# Patient Record
Sex: Male | Born: 1970 | Race: Black or African American | Hispanic: No | Marital: Single | Smoking: Never smoker
Health system: Southern US, Community
[De-identification: ages and names within clinical notes are randomized; demographics above are authoritative.]

## PROBLEM LIST (undated history)

## (undated) DIAGNOSIS — C61 Malignant neoplasm of prostate: Secondary | ICD-10-CM

## (undated) DIAGNOSIS — I10 Essential (primary) hypertension: Secondary | ICD-10-CM

## (undated) HISTORY — PX: PROSTATE BIOPSY: SHX241

---

## 2011-05-18 ENCOUNTER — Ambulatory Visit: Payer: Self-pay | Admitting: Physician Assistant

## 2011-05-25 ENCOUNTER — Ambulatory Visit: Payer: Self-pay | Admitting: Physician Assistant

## 2011-06-04 ENCOUNTER — Ambulatory Visit (INDEPENDENT_AMBULATORY_CARE_PROVIDER_SITE_OTHER): Payer: 59

## 2011-06-04 DIAGNOSIS — B9789 Other viral agents as the cause of diseases classified elsewhere: Secondary | ICD-10-CM

## 2011-06-22 ENCOUNTER — Encounter (INDEPENDENT_AMBULATORY_CARE_PROVIDER_SITE_OTHER): Payer: 59 | Admitting: Physician Assistant

## 2011-06-22 DIAGNOSIS — I10 Essential (primary) hypertension: Secondary | ICD-10-CM

## 2011-06-22 DIAGNOSIS — Z0000 Encounter for general adult medical examination without abnormal findings: Secondary | ICD-10-CM

## 2011-10-19 ENCOUNTER — Other Ambulatory Visit: Payer: Self-pay | Admitting: Physician Assistant

## 2012-01-30 ENCOUNTER — Emergency Department (HOSPITAL_BASED_OUTPATIENT_CLINIC_OR_DEPARTMENT_OTHER)
Admission: EM | Admit: 2012-01-30 | Discharge: 2012-01-30 | Disposition: A | Discharge status: Home / Self Care | Payer: Self-pay | Attending: Emergency Medicine | Admitting: Emergency Medicine

## 2012-01-30 ENCOUNTER — Encounter (HOSPITAL_BASED_OUTPATIENT_CLINIC_OR_DEPARTMENT_OTHER): Payer: Self-pay | Admitting: *Deleted

## 2012-01-30 DIAGNOSIS — K089 Disorder of teeth and supporting structures, unspecified: Secondary | ICD-10-CM | POA: Insufficient documentation

## 2012-01-30 DIAGNOSIS — I10 Essential (primary) hypertension: Secondary | ICD-10-CM | POA: Insufficient documentation

## 2012-01-30 DIAGNOSIS — K0889 Other specified disorders of teeth and supporting structures: Secondary | ICD-10-CM

## 2012-01-30 HISTORY — DX: Essential (primary) hypertension: I10

## 2012-01-30 MED ORDER — HYDROCODONE-ACETAMINOPHEN 10-500 MG PO TABS: 1.0000 | ORAL_TABLET | Freq: Four times a day (QID) | ORAL | Status: AC | PRN

## 2012-01-30 MED ORDER — PENICILLIN V POTASSIUM 500 MG PO TABS: 500.0000 mg | ORAL_TABLET | Freq: Four times a day (QID) | ORAL | Status: AC

## 2012-01-30 MED ORDER — HYDROCODONE-ACETAMINOPHEN 5-325 MG PO TABS
1.0000 | ORAL_TABLET | Freq: Once | ORAL | Status: AC
  Administered 2012-01-30: 1 via ORAL
  Filled 2012-01-30: qty 1

## 2012-01-30 NOTE — ED Notes (Signed)
Pt. Does have small amt of edema in the lower R jaw area.

## 2012-01-30 NOTE — ED Provider Notes (Signed)
History     CSN: [id number]  Arrival date & time 01/30/12  0016   First MD Initiated Contact with Patient 01/30/12 [phone]      Chief Complaint  Patient presents with  . Dental Pain    (Consider location/radiation/quality/duration/timing/severity/associated sxs/prior treatment) HPI Is a 41 year old black male with a several day history of pain in his right lower second molar. The smaller has been previously filled but he believes it is cracked. He is having moderate to severe pain in it. He took a friend's hydrocodone with relief but requests additional medication pending followup with his dentist the day after tomorrow. He has an appointment.  Past Medical History  Diagnosis Date  . Hypertension     History reviewed. No pertinent past surgical history.  No family history on file.  History  Substance Use Topics  . Smoking status: Not on file  . Smokeless tobacco: Not on file  . Alcohol Use:       Review of Systems  All other systems reviewed and are negative.    Allergies  Review of patient's allergies indicates no known allergies.  Home Medications   Current Outpatient Rx  Name Route Sig Dispense Refill  . LISINOPRIL-HYDROCHLOROTHIAZIDE 10-12.5 MG PO TABS  TAKE ONE TABLET BY MOUTH EVERY DAY 30 tablet 2    BP 135/81  Pulse 101  Temp 98.6 F (37 C) (Oral)  Resp 18  Ht 6' (1.829 m)  Wt 230 lb (104.327 kg)  BMI 31.19 kg/m2  SpO2 100%  Physical Exam General: Well-developed, well-nourished male in no acute distress; appearance consistent with age of record HENT: normocephalic, atraumatic; right lower second molar with amalgam filling, mildly tender to percussion Eyes: pupils equal round and reactive to light; extraocular muscles intact; arcus senilis bilateral Neck: supple; no lymphadenopathy Heart: regular rate and rhythm Lungs: clear to auscultation bilaterally Abdomen: soft; nondistended Extremities: No deformity; full range of motion Neurologic:  Awake, alert and oriented; motor function intact in all extremities and symmetric; no facial droop Skin: Warm and dry Psychiatric: Normal mood and affect    ED Course  Procedures (including critical care time)     MDM          Hanley Seamen, MD 01/30/12 1191

## 2012-03-16 ENCOUNTER — Other Ambulatory Visit: Payer: Self-pay | Admitting: Physician Assistant

## 2012-07-31 ENCOUNTER — Other Ambulatory Visit: Payer: Self-pay | Admitting: Physician Assistant

## 2012-08-01 NOTE — Telephone Encounter (Signed)
Please pull chart.

## 2012-08-02 NOTE — Telephone Encounter (Signed)
Chart pulled at pa pool DOS: 06/22/11

## 2012-11-01 ENCOUNTER — Other Ambulatory Visit: Payer: Self-pay | Admitting: Physician Assistant

## 2013-02-20 ENCOUNTER — Ambulatory Visit (INDEPENDENT_AMBULATORY_CARE_PROVIDER_SITE_OTHER): Payer: 59 | Admitting: Emergency Medicine

## 2013-02-20 ENCOUNTER — Telehealth: Payer: Self-pay

## 2013-02-20 VITALS — BP 120/78 | HR 88 | Temp 98.7°F | Resp 18 | Ht 72.5 in | Wt 232.0 lb

## 2013-02-20 DIAGNOSIS — I10 Essential (primary) hypertension: Secondary | ICD-10-CM

## 2013-02-20 LAB — LIPID PANEL
HDL: 66 mg/dL (ref >39)
LDL Cholesterol: 125 mg/dL — ABNORMAL HIGH (ref 0–99)
Total CHOL/HDL Ratio: 3 Ratio
Triglycerides: 42 mg/dL (ref <150)

## 2013-02-20 LAB — COMPREHENSIVE METABOLIC PANEL
AST: 22 U/L (ref 0–37)
Alkaline Phosphatase: 48 U/L (ref 39–117)
BUN: 13 mg/dL (ref 6–23)
Creat: 0.97 mg/dL (ref 0.50–1.35)

## 2013-02-20 MED ORDER — LISINOPRIL-HYDROCHLOROTHIAZIDE 10-12.5 MG PO TABS: 1.0000 | ORAL_TABLET | Freq: Every day | ORAL | Status: DC

## 2013-02-20 NOTE — Telephone Encounter (Signed)
Patient calling to get a refill on his lisinopril was here DOS: 06/22/11; no epic visits. He was told to contact us by his pharmacy because they have not received anything yet. Patient uses pharmacy at [facility]. Samuel Allen- [address].  Please notify patient if this can be refilled or not.   Thanks!  Best number: [phone]

## 2013-02-20 NOTE — Telephone Encounter (Signed)
Please advise this patient that he needs to be seen.  Last seen 06/22/2011.

## 2013-02-20 NOTE — Telephone Encounter (Signed)
Spoke with patient he doesn't have insurance but will speak with someone about payment arrangements but he will come in to get medicine filled

## 2013-02-20 NOTE — Patient Instructions (Addendum)

## 2013-02-20 NOTE — Addendum Note (Signed)
Addended by: Carmelina Dane on: 02/20/2013 04:16 PM   Modules accepted: Orders

## 2013-02-20 NOTE — Progress Notes (Signed)
[facility] [address] [place] [address] [phone]  Date:  02/20/2013   Name:  Samuel Allen   DOB:  April 11, 1971   MRN:  [id number]  PCP:  No PCP Per Patient    Chief Complaint: Follow-up   History of Present Illness:  Samuel Allen is a 42 y.o. very pleasant male patient who presents with the following:  History of hypertension.  Under good control with no adverse side effect.  No improvement with over the counter medications or other home remedies. Denies other complaint or health concern today.   There are no active problems to display for this patient.   Past Medical History  Diagnosis Date  . Hypertension     History reviewed. No pertinent past surgical history.  History  Substance Use Topics  . Smoking status: Never Smoker   . Smokeless tobacco: Not on file  . Alcohol Use: Yes    History reviewed. No pertinent family history.  No Known Allergies  Medication list has been reviewed and updated.  Current Outpatient Prescriptions on File Prior to Visit  Medication Sig Dispense Refill  . lisinopril-hydrochlorothiazide (PRINZIDE,ZESTORETIC) 10-12.5 MG per tablet Take 1 tablet by mouth daily. Needs office visit/CPE  30 tablet  0   No current facility-administered medications on file prior to visit.    Review of Systems:  As per HPI, otherwise negative.    Physical Examination: Filed Vitals:   02/20/13 1601  BP: 120/78  Pulse: 88  Temp: 98.7 F (37.1 C)  Resp: 18   Filed Vitals:   02/20/13 1601  Height: 6' 0.5" (1.842 m)  Weight: 232 lb (105.235 kg)   Body mass index is 31.02 kg/(m^2). Ideal Body Weight: Weight in (lb) to have BMI = 25: 186.5  GEN: WDWN, NAD, Non-toxic, A & O x 3 HEENT: Atraumatic, Normocephalic. Neck supple. No masses, No LAD. Ears and Nose: No external deformity. CV: RRR, No M/G/R. No JVD. No thrill. No extra heart sounds. PULM: CTA B, no wheezes, crackles, rhonchi. No retractions. No resp.  distress. No accessory muscle use. ABD: S, NT, ND, +BS. No rebound. No HSM. EXTR: No c/c/e NEURO Normal gait.  PSYCH: Normally interactive. Conversant. Not depressed or anxious appearing.  Calm demeanor.    Assessment and Plan: Hypertension Refill meds Labs  Signed,  Phillips Odor, MD

## 2015-01-29 ENCOUNTER — Other Ambulatory Visit: Payer: Self-pay | Admitting: Emergency Medicine

## 2018-07-26 ENCOUNTER — Other Ambulatory Visit: Payer: Self-pay | Admitting: Family

## 2018-07-26 ENCOUNTER — Ambulatory Visit
Admission: RE | Admit: 2018-07-26 | Discharge: 2018-07-26 | Disposition: A | Discharge status: Home / Self Care | Payer: BLUE CROSS/BLUE SHIELD | Source: Ambulatory Visit | Attending: Family | Admitting: Family

## 2018-07-26 DIAGNOSIS — R059 Cough, unspecified: Secondary | ICD-10-CM

## 2018-07-26 DIAGNOSIS — R05 Cough: Secondary | ICD-10-CM

## 2018-07-26 DIAGNOSIS — R0600 Dyspnea, unspecified: Secondary | ICD-10-CM

## 2018-07-26 IMAGING — CR DG CHEST 2V
2 series · 2 of 2 positions shown · non-contrast
Comparison: None.

CLINICAL DATA: Productive cough.  Shortness of breath.

EXAM:
CHEST - 2 VIEW

[w chest pa]
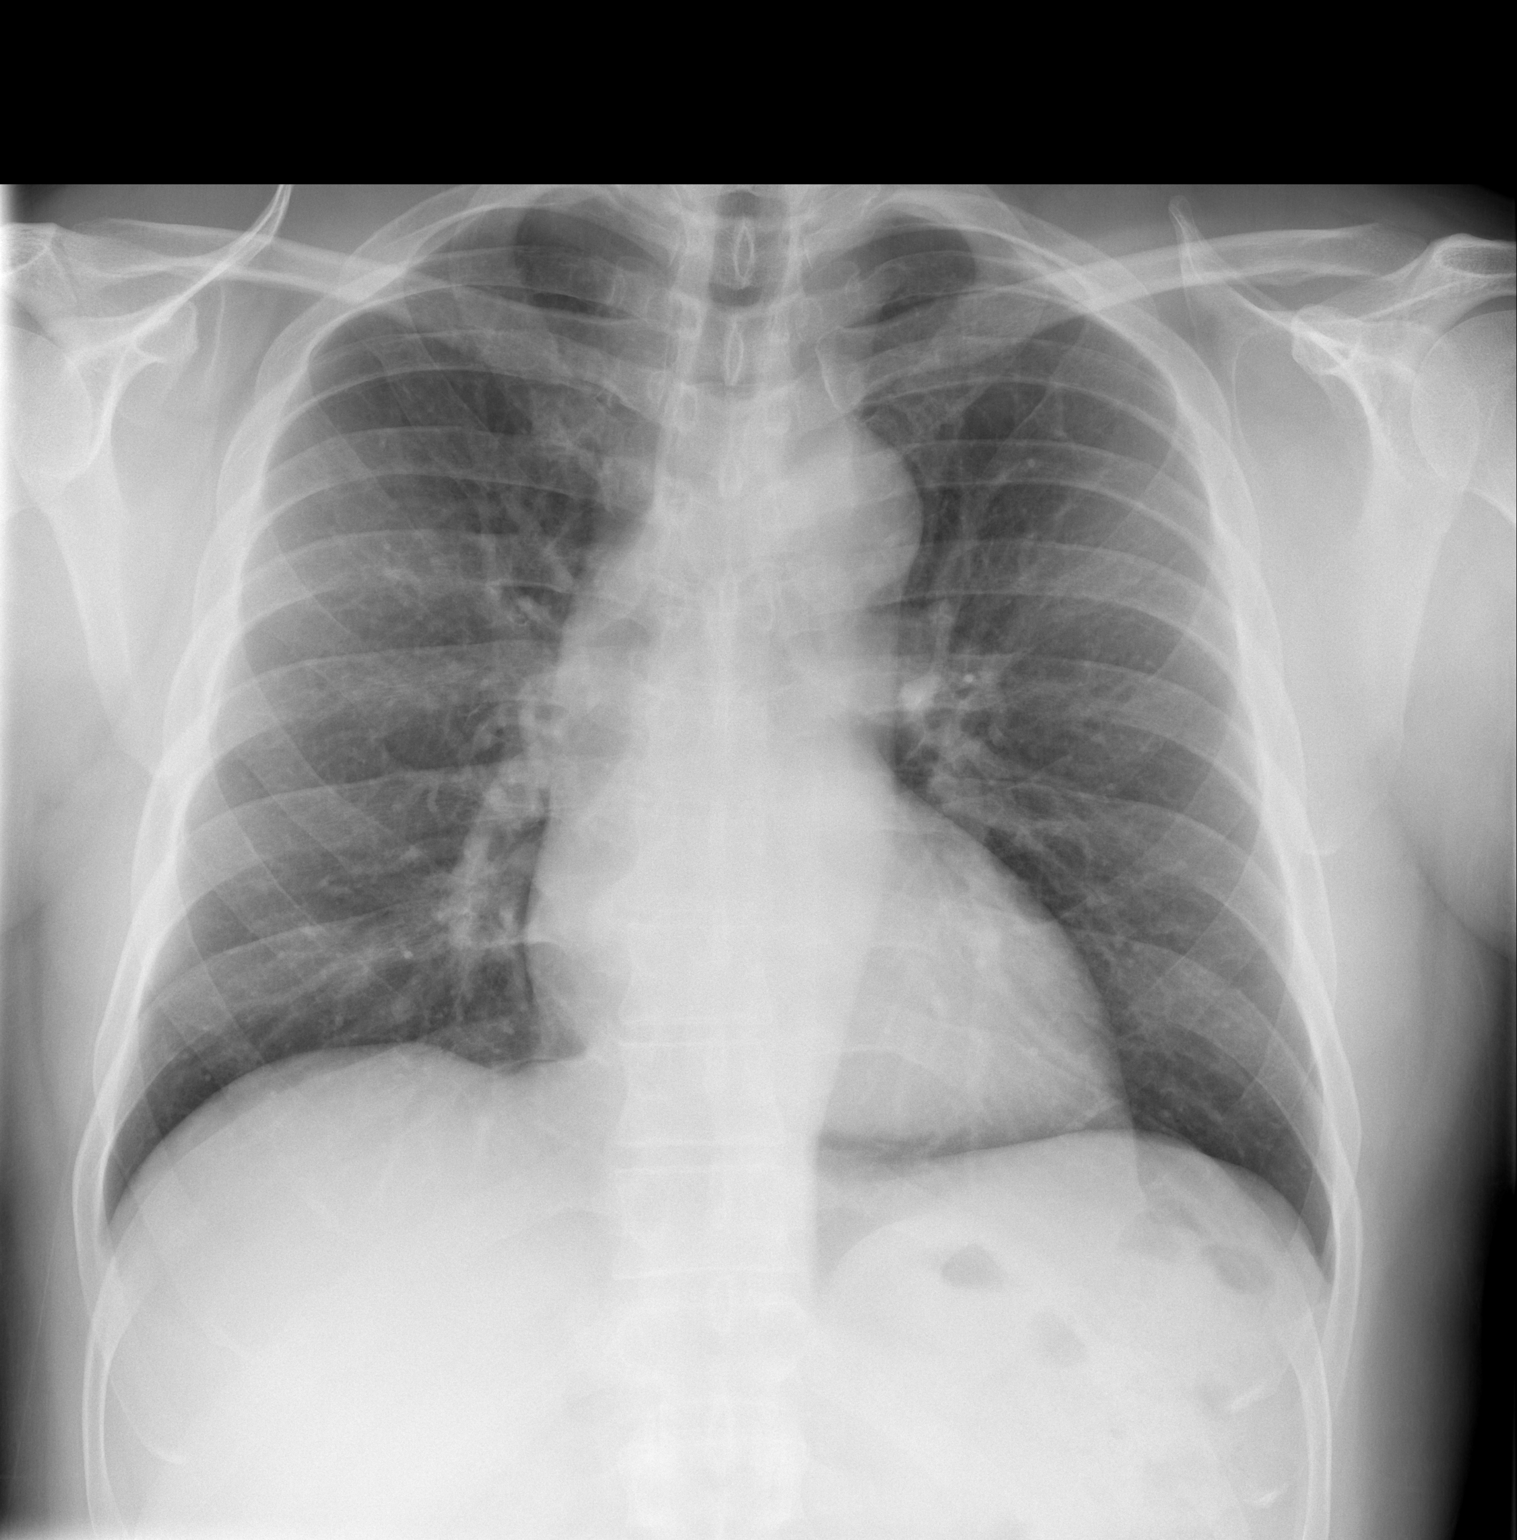

[w chest lat]
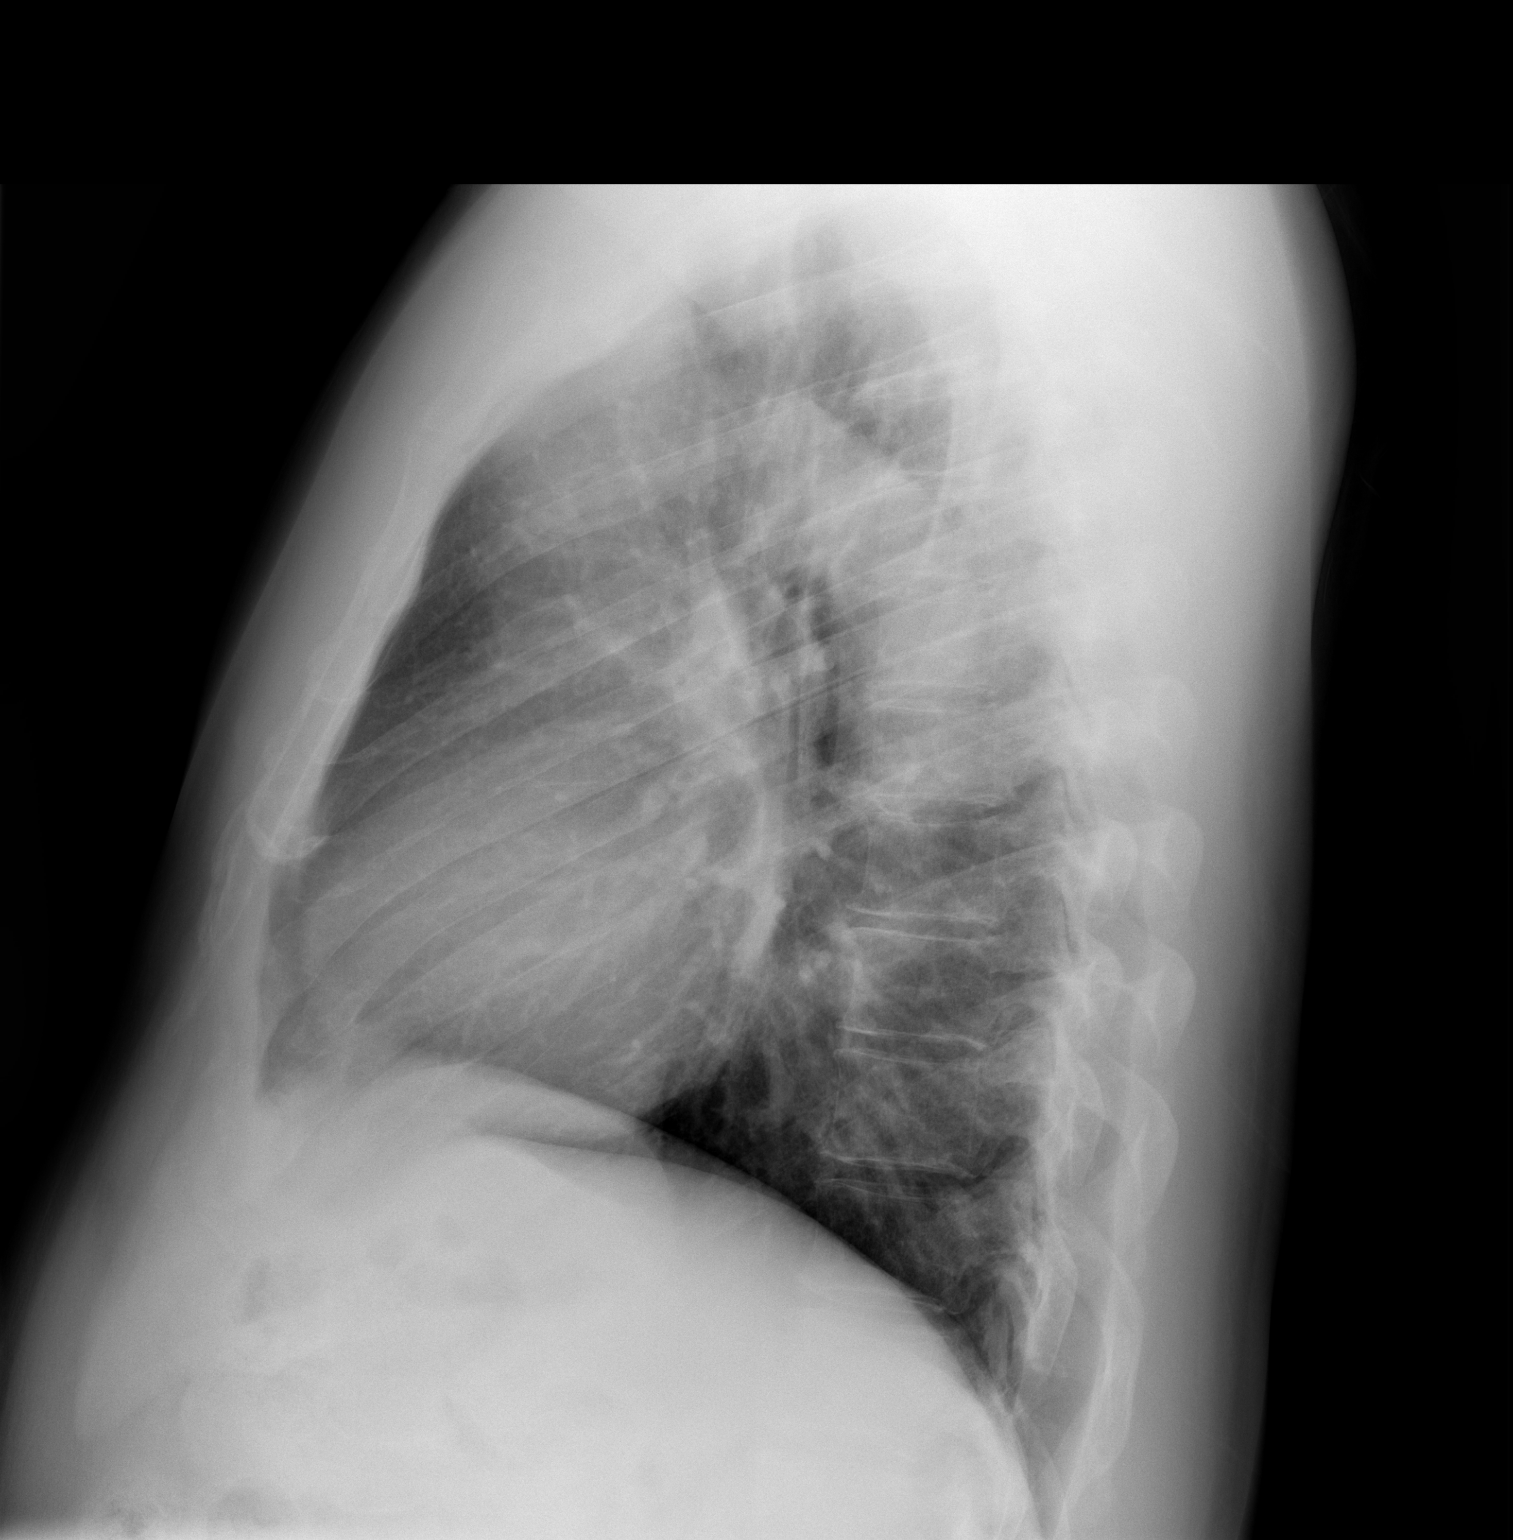

[2 of 2 positions shown; findings below may reference images not displayed]

FINDINGS: The heart size and mediastinal contours are within normal limits.
Both lungs are clear. The visualized skeletal structures are
unremarkable.
IMPRESSION: No active cardiopulmonary disease.

## 2020-01-29 ENCOUNTER — Encounter: Payer: Self-pay | Admitting: Radiation Oncology

## 2020-01-29 NOTE — Progress Notes (Addendum)
GU Location of Tumor / Histology: prostatic adenocarcinoma  If Prostate Cancer, Gleason Score is (3 + 3) and PSA is (3.28). Prostate volume: 58.2 cc  Samuel Allen had a vasectomy 01/2018. He returned to urology clinic after PCP noted an elevated psa in 07/2018.   Biopsies of prostate (if applicable) revealed:   Past/Anticipated interventions by urology, if any: vasectomy, prostate biopsy, active surveillance  Past/Anticipated interventions by medical oncology, if any: no  Weight changes, if any: denies  Bowel/Bladder complaints, if any:  IPSS 2. SHIM 25. Denies dysuria, hematuria, urinary leakage or incontinence. Denies any bowel complaints.   Nausea/Vomiting, if any: denies  Pain issues, if any:  denies  SAFETY ISSUES:  Prior radiation? denies  Pacemaker/ICD? denies  Possible current pregnancy? no, male patient  Is the patient on methotrexate? denies  Current Complaints / other details:  49 year old male. Divorced. Works as a Art gallery manager. Reports multiple uncles and his maternal GF had prostate cancer. Also, he reports his mother had a brain tumor.

## 2020-01-30 ENCOUNTER — Ambulatory Visit
Admission: RE | Admit: 2020-01-30 | Discharge: 2020-01-30 | Disposition: A | Discharge status: Home / Self Care | Payer: 59 | Source: Ambulatory Visit | Attending: Radiation Oncology | Admitting: Radiation Oncology

## 2020-01-30 ENCOUNTER — Encounter: Payer: Self-pay | Admitting: Urology

## 2020-01-30 ENCOUNTER — Encounter: Payer: Self-pay | Admitting: Radiation Oncology

## 2020-01-30 ENCOUNTER — Encounter: Payer: Self-pay | Admitting: Medical Oncology

## 2020-01-30 ENCOUNTER — Other Ambulatory Visit: Payer: Self-pay

## 2020-01-30 VITALS — BP 139/98 | HR 82 | Temp 98.8°F | Resp 20 | Ht 74.0 in | Wt 252.2 lb

## 2020-01-30 DIAGNOSIS — Z79899 Other long term (current) drug therapy: Secondary | ICD-10-CM | POA: Insufficient documentation

## 2020-01-30 DIAGNOSIS — Z8042 Family history of malignant neoplasm of prostate: Secondary | ICD-10-CM | POA: Diagnosis not present

## 2020-01-30 DIAGNOSIS — C61 Malignant neoplasm of prostate: Secondary | ICD-10-CM | POA: Insufficient documentation

## 2020-01-30 DIAGNOSIS — I10 Essential (primary) hypertension: Secondary | ICD-10-CM | POA: Insufficient documentation

## 2020-01-30 HISTORY — DX: Malignant neoplasm of prostate: C61

## 2020-01-30 NOTE — Progress Notes (Signed)
Introduced myself as the prostate nurse navigator and discussed my role. He informs me, he met with  Dr. Alinda Money to discuss surgery and after discussion today, he has opted to continue active surveillance with repeat biopsy.We discussed the importance of being diligent and keeping appointments for PSA  and biopsies.  He has a strong family history of breast cancer and prostate cancer. I discussed genetic testing and he is going to give it some thought. I gave him my business card and asked him to call me if he would like a referral and/or if he has questions or concerns. He voiced understanding.

## 2020-01-30 NOTE — Progress Notes (Signed)
Radiation Oncology         [phone] ________________________________  Initial outpatient Consultation  Name: Samuel Allen MRN: [id number]  Date: 01/30/2020  DOB: [birth year]  [id number], Gayland Curry, FNP  Davis Gourd*   REFERRING PHYSICIAN: Davis Gourd*  DIAGNOSIS: 49 y.o. gentleman with Stage T1c adenocarcinoma of the prostate with Gleason score of 3+3, and PSA of 3.28.    ICD-10-CM   1. Malignant neoplasm of prostate ([place])  C61     HISTORY OF PRESENT ILLNESS: Samuel Allen is a 49 y.o. male with a diagnosis of prostate cancer. He was noted to have an elevated PSA of 5.6 by his primary care provider, Selina Cooley, FNP.  Accordingly, he was referred for evaluation in urology by Dr. Lovena Neighbours on 08/01/2018. He was recommended to return for follow up in 6-8 weeks, but this was cancelled, likely secondary to the pandemic.  He returned for follow up on 08/28/2019, with a PSA at that time of 3.28.  The patient proceeded to transrectal ultrasound with 12 biopsies of the prostate on 01/01/2020.  The prostate volume measured 58.2 cc.  Out of 12 core biopsies, 1 was positive.  The maximum Gleason score was 3+3, and this was seen in 5% of the right apex lateral.  He met with Dr. Alinda Money on 01/16/2020 to discuss treatment options and is leaning towards active surveillance, which is very reasonable.  The patient reviewed the biopsy results with his urologist and he has kindly been referred today for discussion of potential radiation treatment options.  PREVIOUS RADIATION THERAPY: No  PAST MEDICAL HISTORY:  Past Medical History:  Diagnosis Date  . Hypertension   . Prostate cancer ([place])       PAST SURGICAL HISTORY: Past Surgical History:  Procedure Laterality Date  . PROSTATE BIOPSY      FAMILY HISTORY:  Family History  Problem Relation Age of Onset  . Brain cancer Mother   . Prostate cancer Maternal Uncle   . Prostate cancer Maternal Grandfather   . Prostate  cancer Maternal Uncle   . Colon cancer Neg Hx   . Pancreatic cancer Neg Hx   . Breast cancer Neg Hx     SOCIAL HISTORY:  Social History   Socioeconomic History  . Marital status: Single    Spouse name: Not on file  . Number of children: Not on file  . Years of education: Not on file  . Highest education level: Not on file  Occupational History  . Not on file  Tobacco Use  . Smoking status: Never Smoker  . Smokeless tobacco: Never Used  Vaping Use  . Vaping Use: Never used  Substance and Sexual Activity  . Alcohol use: Yes  . Drug use: Yes  . Sexual activity: Yes  Other Topics Concern  . Not on file  Social History Narrative  . Not on file   Social Determinants of Health   Financial Resource Strain:   . Difficulty of Paying Living Expenses: Not on file  Food Insecurity:   . Worried About Charity fundraiser in the Last Year: Not on file  . Ran Out of Food in the Last Year: Not on file  Transportation Needs:   . Lack of Transportation (Medical): Not on file  . Lack of Transportation (Non-Medical): Not on file  Physical Activity:   . Days of Exercise per Week: Not on file  . Minutes of Exercise per Session: Not on file  Stress:   .  Feeling of Stress : Not on file  Social Connections:   . Frequency of Communication with Friends and Family: Not on file  . Frequency of Social Gatherings with Friends and Family: Not on file  . Attends Religious Services: Not on file  . Active Member of Clubs or Organizations: Not on file  . Attends Archivist Meetings: Not on file  . Marital Status: Not on file  Intimate Partner Violence:   . Fear of Current or Ex-Partner: Not on file  . Emotionally Abused: Not on file  . Physically Abused: Not on file  . Sexually Abused: Not on file    ALLERGIES: Patient has no known allergies.  MEDICATIONS:  Current Outpatient Medications  Medication Sig Dispense Refill  . levocetirizine (XYZAL) 5 MG tablet Take 5 mg by mouth  daily.    Marland Kitchen lisinopril (ZESTRIL) 30 MG tablet Take 30 mg by mouth daily.     No current facility-administered medications for this encounter.    REVIEW OF SYSTEMS:  On review of systems, the patient reports that he is doing well overall. He denies any chest pain, shortness of breath, cough, fevers, chills, night sweats, unintended weight changes. He denies any bowel disturbances, and denies abdominal pain, nausea or vomiting. He denies any new musculoskeletal or joint aches or pains. His IPSS was 2, indicating mild urinary symptoms. His SHIM was 25, indicating he does not have erectile dysfunction. A complete review of systems is obtained and is otherwise negative.    PHYSICAL EXAM:  Wt Readings from Last 3 Encounters:  01/30/20 252 lb 3.2 oz (114.4 kg)  02/20/13 232 lb (105.2 kg)  01/30/12 230 lb (104.3 kg)   Temp Readings from Last 3 Encounters:  01/30/20 98.8 F (37.1 C)  02/20/13 98.7 F (37.1 C) (Oral)  01/30/12 98.6 F (37 C) (Oral)   BP Readings from Last 3 Encounters:  01/30/20 (!) 139/98  02/20/13 120/78  01/30/12 135/81   Pulse Readings from Last 3 Encounters:  01/30/20 82  02/20/13 88  01/30/12 101   Pain Assessment Pain Score: 0-No pain/10  In general this is a well appearing African-American male in no acute distress. He is alert and oriented x4 and appropriate throughout the examination. HEENT reveals that the patient is normocephalic, atraumatic. EOMs are intact. PERRLA. Skin is intact without any evidence of gross lesions. Cardiopulmonary assessment is negative for acute distress and he exhibits normal effort.The abdomen is soft, non tender, non distended. Lower extremities are negative for pretibial pitting edema, deep calf tenderness, cyanosis or clubbing.   KPS = 100  100 - Normal; no complaints; no evidence of disease. 90   - Able to carry on normal activity; minor signs or symptoms of disease. 80   - Normal activity with effort; some signs or symptoms  of disease. 59   - Cares for self; unable to carry on normal activity or to do active work. 60   - Requires occasional assistance, but is able to care for most of his personal needs. 50   - Requires considerable assistance and frequent medical care. 70   - Disabled; requires special care and assistance. 35   - Severely disabled; hospital admission is indicated although death not imminent. 59   - Very sick; hospital admission necessary; active supportive treatment necessary. 10   - Moribund; fatal processes progressing rapidly. 0     - Dead  Karnofsky DA, Abelmann WH, Craver LS and Burchenal [facility] [phone]) The use of the nitrogen  mustards in the palliative treatment of carcinoma: with particular reference to bronchogenic carcinoma Cancer 1 [id number]  LABORATORY DATA:  No results found for: WBC, HGB, HCT, MCV, PLT Lab Results  Component Value Date   NA 135 02/20/2013   K 4.3 02/20/2013   CL 101 02/20/2013   CO2 27 02/20/2013   Lab Results  Component Value Date   ALT 41 02/20/2013   AST 22 02/20/2013   ALKPHOS 48 02/20/2013   BILITOT 0.5 02/20/2013     RADIOGRAPHY: No results found.    IMPRESSION/PLAN: 1. 49 y.o. gentleman with Stage T1c adenocarcinoma of the prostate with Gleason Score of 3+3, and PSA of 3.28. We discussed the patient's workup and outlined the nature of prostate cancer in this setting. The patient's T stage, Gleason's score, and PSA put him into the very low risk group. Accordingly, he is eligible for a variety of potential treatment options including brachytherapy, 5.5 weeks of external radiation, prostatectomy, or active surveillance. We discussed the guideline recommendations to proceed with active surveillance which will yield an equally favorable outcome and curability with treatment in the future should there be evidence of disease progression.  We also briefly discussed the available radiation techniques, and focused on the details and logistics of delivery. We  discussed and outlined the risks, benefits, short and long-term effects associated with radiotherapy and compared and contrasted these with prostatectomy. We discussed the role of SpaceOAR gel in reducing the rectal toxicity associated with radiotherapy.  He appears to have a good understanding of his disease and our treatment recommendation to proceed in active surveillance which will involve close monitoring of the PSA and repeat biopsies as well as potential MRI imaging.  He was encouraged to ask questions that were answered to his stated satisfaction.  At the conclusion of our conversation, the patient is interested in moving forward with active surveillance and is a very reliable patient.  We will share our discussion with Dr. Lovena Neighbours so that he can proceed with the planned surveillance with prostate MRI and repeat PSA in 6 months.  We enjoyed meeting him today and look forward to following his progress.  He knows that we would be more than happy to continue to participate in his care if clinically indicated in the future.  We also advised him to call with any further questions or concerns related to radiation.    Nicholos Johns, PA-C    Tyler Pita, MD  [facility] Direct Dial: [phone]  Fax: [phone] [email or web address]  Skype  [email or web address]   This document serves as a record of services personally performed by Tyler Pita, MD and Freeman Caldron, PA-C. It was created on their behalf by Wilburn Mylar, a trained medical scribe. The creation of this record is based on the scribe's personal observations and the provider's statements to them. This document has been checked and approved by the attending provider.

## 2020-05-10 ENCOUNTER — Ambulatory Visit
Admission: EM | Admit: 2020-05-10 | Discharge: 2020-05-10 | Disposition: A | Discharge status: Home / Self Care | Payer: 59 | Attending: Family Medicine | Admitting: Family Medicine

## 2020-05-10 DIAGNOSIS — L03011 Cellulitis of right finger: Secondary | ICD-10-CM | POA: Diagnosis not present

## 2020-05-10 MED ORDER — PREDNISONE 20 MG PO TABS: 20.0000 mg | ORAL_TABLET | Freq: Every day | ORAL | 0 refills | Status: AC

## 2020-05-10 MED ORDER — DOXYCYCLINE HYCLATE 100 MG PO CAPS: 100.0000 mg | ORAL_CAPSULE | Freq: Two times a day (BID) | ORAL | 0 refills | Status: AC

## 2020-05-10 NOTE — ED Triage Notes (Signed)
Pt c/o swelling, redness, and warmth to rt pinky since yesterday morning. Denies injury.

## 2020-05-10 NOTE — ED Provider Notes (Signed)
[facility]    CSN: [id number] Arrival date & time: 05/10/20  1825      History   Chief Complaint Chief Complaint  Patient presents with  . rt pinky swelling    HPI Samuel Allen is a 49 y.o. male.   HPI  Patient presents today with right fifth finger tenderness and swelling.  Patient denies known injury. Symptoms began today. He has had increased swelling and warmth involving the fifth finger.  He denies foreign body in finger or any recent biting of nails. He is afebrile. Past Medical History:  Diagnosis Date  . Hypertension   . Prostate cancer [facility])     Patient Active Problem List   Diagnosis Date Noted  . Malignant neoplasm of prostate ([place]) 01/30/2020    Past Surgical History:  Procedure Laterality Date  . PROSTATE BIOPSY         Home Medications    Prior to Admission medications   Medication Sig Start Date End Date Taking? Authorizing Provider  levocetirizine (XYZAL) 5 MG tablet Take 5 mg by mouth daily. 10/23/19   [provider]  lisinopril (ZESTRIL) 30 MG tablet Take 30 mg by mouth daily. 10/23/19   [provider]    Family History Family History  Problem Relation Age of Onset  . Brain cancer Mother   . Prostate cancer Maternal Uncle   . Prostate cancer Maternal Grandfather   . Prostate cancer Maternal Uncle   . Colon cancer Neg Hx   . Pancreatic cancer Neg Hx   . Breast cancer Neg Hx     Social History Social History   Tobacco Use  . Smoking status: Never Smoker  . Smokeless tobacco: Never Used  Vaping Use  . Vaping Use: Never used  Substance Use Topics  . Alcohol use: Yes  . Drug use: Yes     Allergies   Patient has no known allergies.   Review of Systems Review of Systems Pertinent negatives listed in HPI  Physical Exam Triage Vital Signs ED Triage Vitals  Enc Vitals Group     BP 05/10/20 1947 125/88     Pulse Rate 05/10/20 1947 87     Resp 05/10/20 1947 20     Temp 05/10/20 1947 98.2 F  (36.8 C)     Temp Source 05/10/20 1947 Oral     SpO2 05/10/20 1947 96 %     Weight --      Height --      Head Circumference --      Peak Flow --      Pain Score 05/10/20 1956 0     Pain Loc --      Pain Edu? --      Excl. in [place]? --    No data found.  Updated Vital Signs BP 125/88 (BP Location: Left Arm)   Pulse 87   Temp 98.2 F (36.8 C) (Oral)   Resp 20   SpO2 96%   Visual Acuity Right Eye Distance:   Left Eye Distance:   Bilateral Distance:    Right Eye Near:   Left Eye Near:    Bilateral Near:     Physical Exam General appearance: alert, well developed, well nourished Head: Normocephalic, without obvious abnormality, atraumatic Respiratory: Respirations even and unlabored, normal respiratory rate Heart: rate and rhythm normal. No gallop or murmurs noted on exam  Abdomen: BS +, no distention, no rebound tenderness, or no mass Extremities: Right 5th digit, swelling tender tip  of DIP joint and erythema  Skin: Skin color, texture, turgor normal. No rashes seen  Psych: Appropriate mood and affect. Neurologic: Mental status: Alert, oriented to person, place, and time, thought content appropriate.   UC Treatments / Results  Labs (all labs ordered are listed, but only abnormal results are displayed) Labs Reviewed - No data to display  EKG   Radiology No results found.  Procedures Procedures (including critical care time)  Medications Ordered in UC Medications - No data to display  Initial Impression / Assessment and Plan / UC Course  I have reviewed the triage vital signs and the nursing notes.  Pertinent labs & imaging results that were available during my care of the patient were reviewed by me and considered in my medical decision making (see chart for details).    Acute paronychia x1 day however there is some concern that the infection could possibly transition into a felon advised patient if the tenderness and swelling has not significantly  decreased within the next 72 hours I would like for him to follow-up for reevaluation to ensure antibiotics is treating the underlying infection.  Patient had pretty significant swelling at the above the DIP joint at surrounding the nailbed and at the palmar surface of the right 5th finger. Final Clinical Impressions(s) / UC Diagnoses   Final diagnoses:  Paronychia, acute, finger, right   Discharge Instructions   None    ED Prescriptions    Medication Sig Dispense Auth. Provider   predniSONE (DELTASONE) 20 MG tablet Take 1 tablet (20 mg total) by mouth daily with breakfast for 5 days. 5 tablet Scot Jun, FNP   doxycycline (VIBRAMYCIN) 100 MG capsule Take 1 capsule (100 mg total) by mouth 2 (two) times daily. 20 capsule Scot Jun, FNP     PDMP not reviewed this encounter.   Scot Jun, [place] 05/12/20 [phone]

## 2020-05-28 ENCOUNTER — Other Ambulatory Visit: Payer: Self-pay | Admitting: Urology

## 2020-05-28 DIAGNOSIS — C61 Malignant neoplasm of prostate: Secondary | ICD-10-CM

## 2020-07-02 ENCOUNTER — Other Ambulatory Visit: Payer: Self-pay

## 2020-07-02 ENCOUNTER — Ambulatory Visit
Admission: RE | Admit: 2020-07-02 | Discharge: 2020-07-02 | Disposition: A | Discharge status: Home / Self Care | Payer: 59 | Source: Ambulatory Visit | Attending: Urology | Admitting: Urology

## 2020-07-02 DIAGNOSIS — C61 Malignant neoplasm of prostate: Secondary | ICD-10-CM

## 2020-07-02 IMAGING — MR MR PROSTATE WO/W CM
12 series · 48 of 48 positions shown · IV contrast (multihance)
Comparison: None.

CLINICAL DATA: Prostate carcinoma, Gleason score 3+3=6. Active
surveillance.

EXAM:
MR PROSTATE WITHOUT AND WITH CONTRAST
TECHNIQUE: Multiplanar multisequence MRI images were obtained of the pelvis
centered about the prostate. Pre and post contrast images were
obtained.
CONTRAST:  20mL MULTIHANCE GADOBENATE DIMEGLUMINE 529 MG/ML IV SOLN

[Series 3: T2 · coronal · 3.0mm · 0.56mm/px · 1 of 26 slices shown (1 of 3)]
[im 1/26]
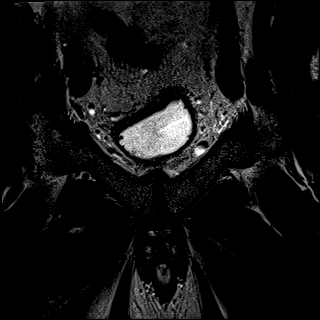

[Series 4: T1 · axial · 5.0mm · 1.25mm/px · 1 of 88 slices shown]
[im 1/88]
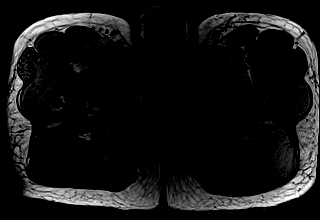

[Series 5: DWI · axial · 3.0mm · 1.75mm/px · z∈[-12,+57]mm · 2 of 72 slices shown (1 of 3)]
[im 1/72]
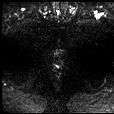
[im 72/72]
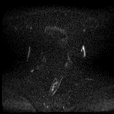

[Series 6: DWI · axial · 3.0mm · 1.75mm/px · 1 of 24 slices shown (2 of 3)]
[im 1/24]
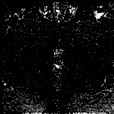

[Series 7: DWI · axial · 3.0mm · 1.75mm/px · 1 of 24 slices shown (3 of 3)]
[im 1/24]
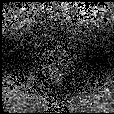

[Series 8: T2 · axial · 3.0mm · 0.56mm/px · 1 of 23 slices shown (2 of 3)]
[im 1/23]
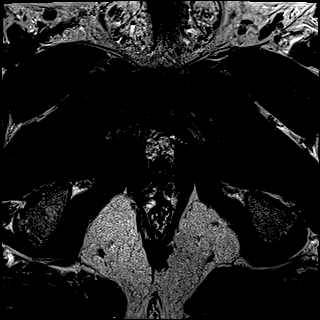

[Series 9: T2 · axial · 1.0mm · 1.04mm/px · z∈[-17,+62]mm · 2 of 80 slices shown (3 of 3)]
[im 1/80]
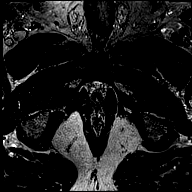
[im 80/80]
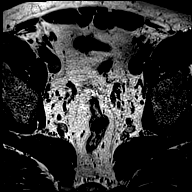

[Series 10: pre t1_twist_tra_dyn · axial · non-contrast · 3.5mm · 0.83mm/px · 1 of 22 slices shown]
[im 1/22]
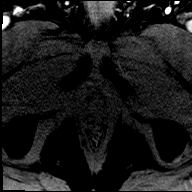

[Series 11: post t1_twist_tra_dyn-copy center · axial · non-contrast · 3.5mm · 0.83mm/px · z∈[-16,+58]mm · 17 of 660 slices shown]
[im 1/660]
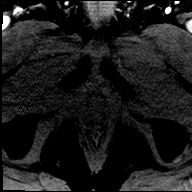
[im 42/660]
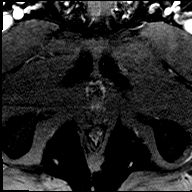
[im 83/660]
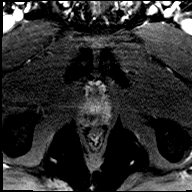
[im 124/660]
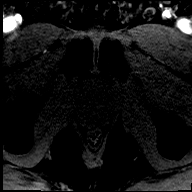
[im 165/660]
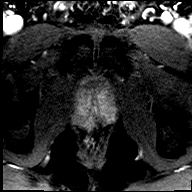
[im 206/660]
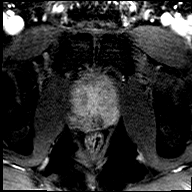
[im 248/660]
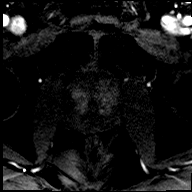
[im 289/660]
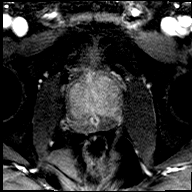
[im 330/660]
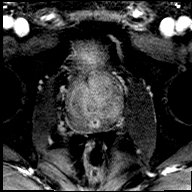
[im 371/660]
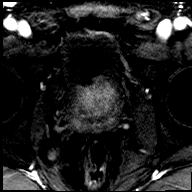
[im 412/660]
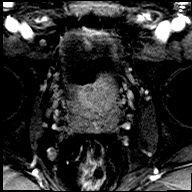
[im 454/660]
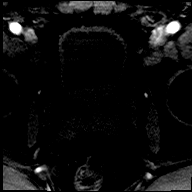
[im 495/660]
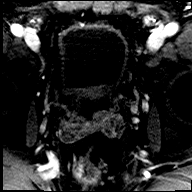
[im 536/660]
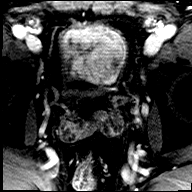
[im 577/660]
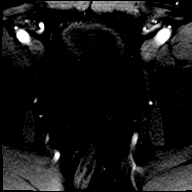
[im 618/660]
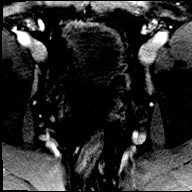
[im 660/660]
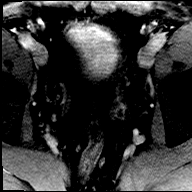

[Series 12: post t1_twist_tra_dyn-copy cent_sub · axial · 3.5mm · 0.83mm/px · z∈[-16,+58]mm · 17 of 638 slices shown]
[im 1/638]
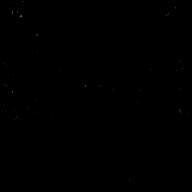
[im 40/638]
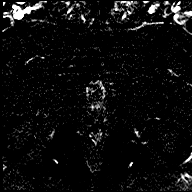
[im 80/638]
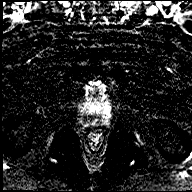
[im 120/638]
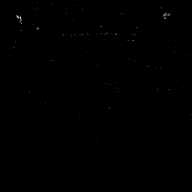
[im 160/638]
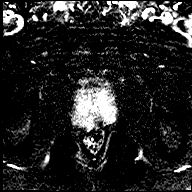
[im 200/638]
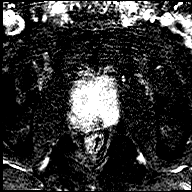
[im 239/638]
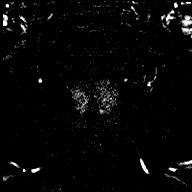
[im 279/638]
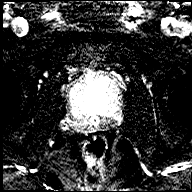
[im 319/638]
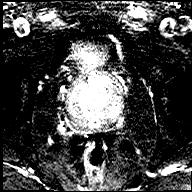
[im 359/638]
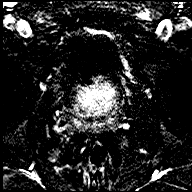
[im 399/638]
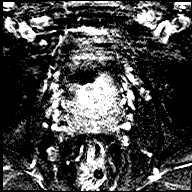
[im 438/638]
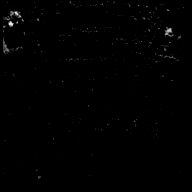
[im 478/638]
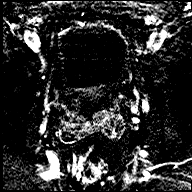
[im 518/638]
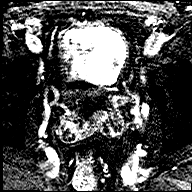
[im 558/638]
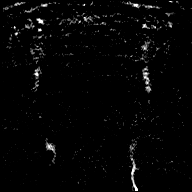
[im 598/638]
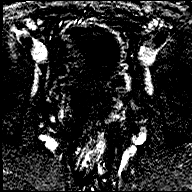
[im 638/638]
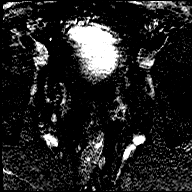

[Series 13: t1_vibe_dixon_tra_f · axial · 2.5mm · 0.91mm/px · z∈[-42,+156]mm · 2 of 80 slices shown]
[im 1/80]
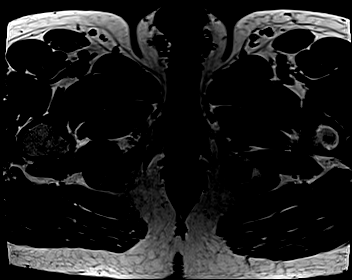
[im 80/80]
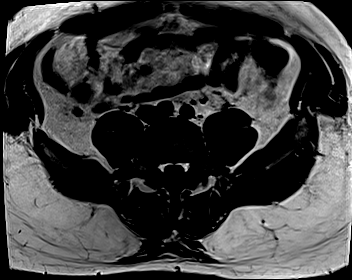

[Series 14: t1_vibe_dixon_tra_w · axial · 2.5mm · 0.91mm/px · z∈[-42,+156]mm · 2 of 80 slices shown]
[im 1/80]
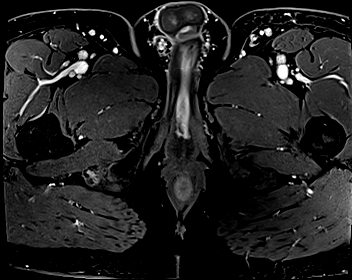
[im 80/80]
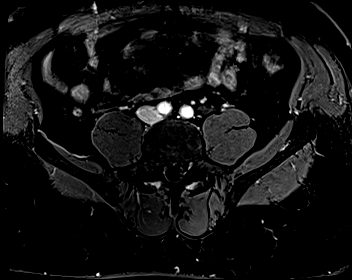

[48 of 48 positions shown; findings below may reference images not displayed]

FINDINGS: Prostate:

-- Peripheral Zone: Linear/wedge shaped hypointensities are noted on
ADC; however, no focal ADC hypointense or high b-value DWI
hyperintense nodules are identified.

-- Transition/Central Zone: Circumscribed BPH nodules are noted, but
no suspicious nodules with obscured or non-circumscribed margins
seen.

-- Measurements/Volume:  5.5 x 5.4 x 4.9 cm (volume = 76 cm^3)

Transcapsular spread:  Absent

Seminal vesicle involvement:  Absent

Neurovascular bundle involvement:  Absent

Pelvic adenopathy: None visualized

Bone metastasis: None visualized

Other: Mild diffuse bladder wall thickening, consistent with chronic
bladder outlet obstruction. Sigmoid diverticulosis, without evidence
of diverticulitis.
IMPRESSION: No radiographic evidence of high-grade prostate carcinoma. PI-RADS
2: Low (clinically significant cancer is unlikely to be present)

## 2020-07-02 MED ORDER — GADOBENATE DIMEGLUMINE 529 MG/ML IV SOLN
20.0000 mL | Freq: Once | INTRAVENOUS | Status: AC | PRN
  Administered 2020-07-02: 20 mL via INTRAVENOUS

## 2021-09-23 ENCOUNTER — Other Ambulatory Visit: Payer: Self-pay | Admitting: Urology

## 2021-09-23 DIAGNOSIS — C61 Malignant neoplasm of prostate: Secondary | ICD-10-CM

## 2021-11-11 ENCOUNTER — Ambulatory Visit
Admission: RE | Admit: 2021-11-11 | Discharge: 2021-11-11 | Disposition: A | Discharge status: Home / Self Care | Payer: Commercial Managed Care - HMO | Source: Ambulatory Visit | Attending: Urology | Admitting: Urology

## 2021-11-11 DIAGNOSIS — C61 Malignant neoplasm of prostate: Secondary | ICD-10-CM

## 2021-11-11 IMAGING — MR MR PROSTATE WO/W CM
12 series · 48 of 48 positions shown · IV contrast (20 ml multihance)
Comparison: [DATE]

CLINICAL DATA: Elevated PSA.

EXAM:
MR PROSTATE WITHOUT AND WITH CONTRAST
TECHNIQUE: Multiplanar multisequence MRI images were obtained of the pelvis
centered about the prostate. Pre and post contrast images were
obtained.
CONTRAST:  20mL MULTIHANCE GADOBENATE DIMEGLUMINE 529 MG/ML IV SOLN

[Series 3: T2 · coronal · 3.0mm · 0.56mm/px · 1 of 25 slices shown (1 of 3)]
[im 1/25]
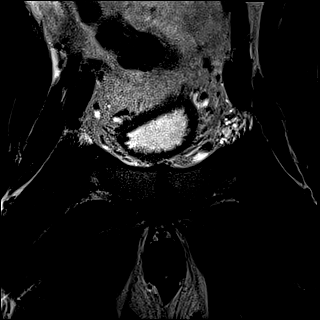

[Series 4: T1 · axial · 5.0mm · 1.25mm/px · z∈[-55,+180]mm · 2 of 96 slices shown]
[im 1/96]
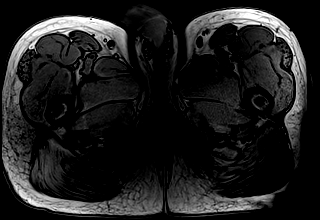
[im 96/96]
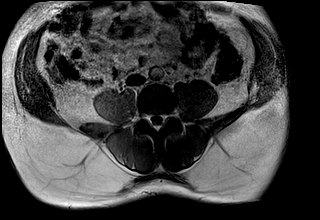

[Series 5: DWI · axial · 3.0mm · 1.75mm/px · z∈[-7,+59]mm · 2 of 69 slices shown (1 of 3)]
[im 1/69]
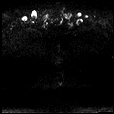
[im 69/69]
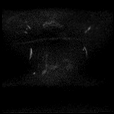

[Series 6: DWI · axial · 3.0mm · 1.75mm/px · 1 of 23 slices shown (2 of 3)]
[im 1/23]
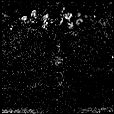

[Series 7: DWI · axial · 3.0mm · 1.75mm/px · 1 of 23 slices shown (3 of 3)]
[im 1/23]
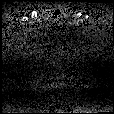

[Series 8: T2 · axial · 3.0mm · 0.56mm/px · 1 of 23 slices shown (2 of 3)]
[im 1/23]
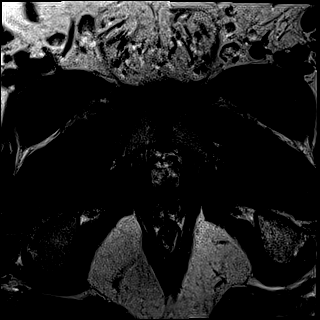

[Series 9: T2 · axial · 1.0mm · 1.04mm/px · z∈[-9,+62]mm · 2 of 72 slices shown (3 of 3)]
[im 1/72]
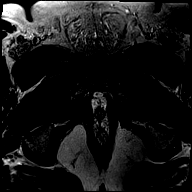
[im 72/72]
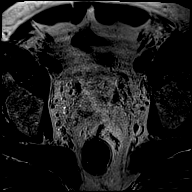

[Series 10: pre t1_twist_tra_dyn · axial · non-contrast · 3.5mm · 0.83mm/px · 1 of 20 slices shown]
[im 1/20]
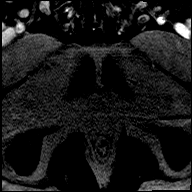

[Series 11: post t1_twist_tra_dyn-copy center · axial · non-contrast · 3.5mm · 0.83mm/px · z∈[-7,+59]mm · 17 of 600 slices shown]
[im 1/600]
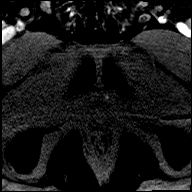
[im 38/600]
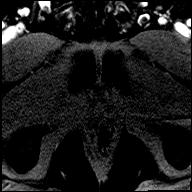
[im 75/600]
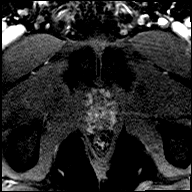
[im 113/600]
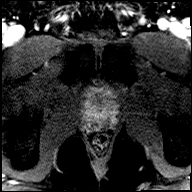
[im 150/600]
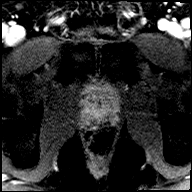
[im 188/600]
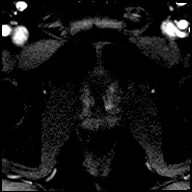
[im 225/600]
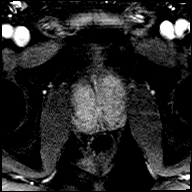
[im 263/600]
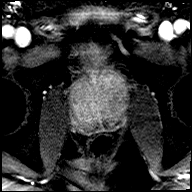
[im 300/600]
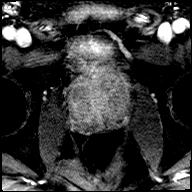
[im 337/600]
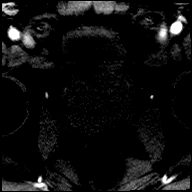
[im 375/600]
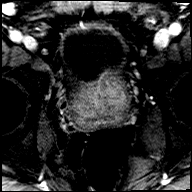
[im 412/600]
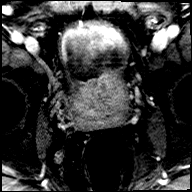
[im 450/600]
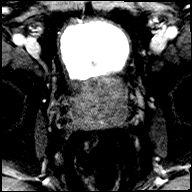
[im 487/600]
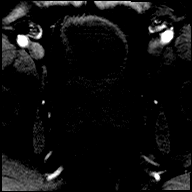
[im 525/600]
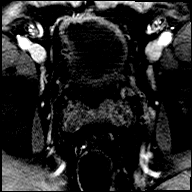
[im 562/600]
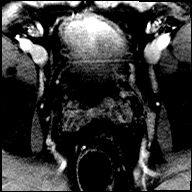
[im 600/600]
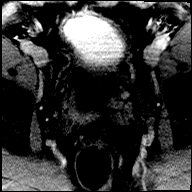

[Series 12: post t1_twist_tra_dyn-copy cent_sub · axial · 3.5mm · 0.83mm/px · z∈[-7,+59]mm · 16 of 580 slices shown]
[im 1/580]
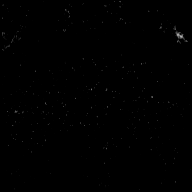
[im 39/580]
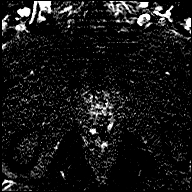
[im 78/580]
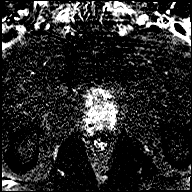
[im 116/580]
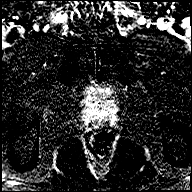
[im 155/580]
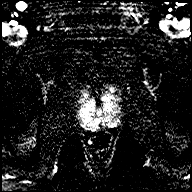
[im 194/580]
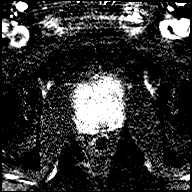
[im 232/580]
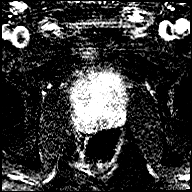
[im 271/580]
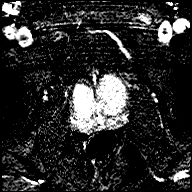
[im 309/580]
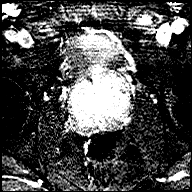
[im 348/580]
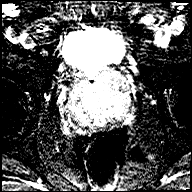
[im 387/580]
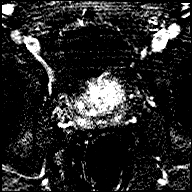
[im 425/580]
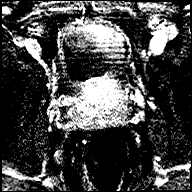
[im 464/580]
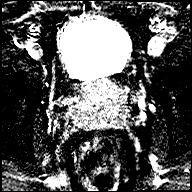
[im 502/580]
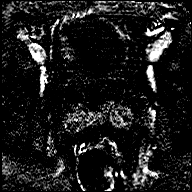
[im 541/580]
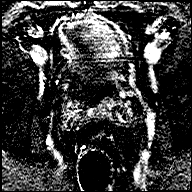
[im 580/580]
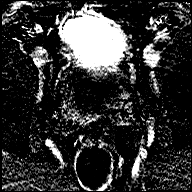

[Series 13: t1_vibe_dixon_tra_f · axial · 2.5mm · 0.91mm/px · z∈[-36,+162]mm · 2 of 80 slices shown]
[im 1/80]
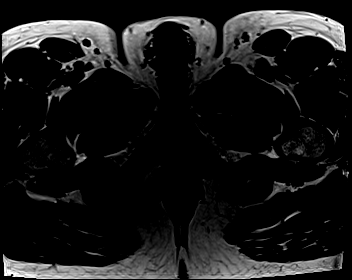
[im 80/80]
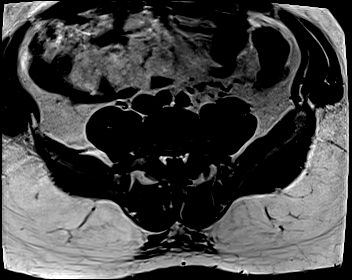

[Series 14: t1_vibe_dixon_tra_w · axial · 2.5mm · 0.91mm/px · z∈[-36,+162]mm · 2 of 80 slices shown]
[im 1/80]
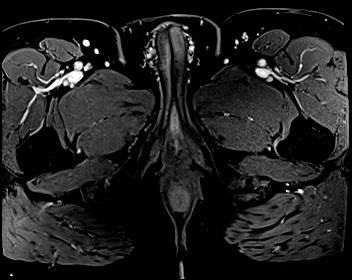
[im 80/80]
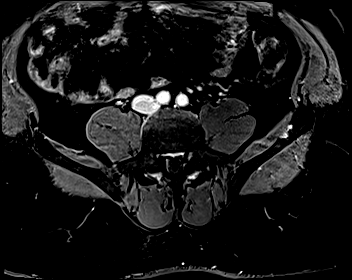

[48 of 48 positions shown; findings below may reference images not displayed]

FINDINGS: Prostate:

-- Peripheral Zone: Linear/wedge shaped hypointensities are noted on
ADC; however, no focal ADC hypointense or high b-value DWI
hyperintense nodules are identified.

-- Transition/Central Zone: Mildly enlarged with multiple BPH
nodules. No suspicious non circumscribed nodules identified.

-- Measurements/Volume:  5.1 by 5.2 x 5 5 cm (volume = 70 cm^3)

Transcapsular spread:  Absent

Seminal vesicle involvement:  Absent

Neurovascular bundle involvement:  Absent

Pelvic adenopathy: None visualized

Bone metastasis: None visualized

Other: Diffuse bladder wall thickening, consistent with chronic
bladder outlet obstruction.
IMPRESSION: No significant change compared to prior study. No radiographic
evidence of high-grade prostate carcinoma. PI-RADS 2 (v.2.1): Low
(clinically significant cancer unlikely)

## 2021-11-11 MED ORDER — GADOBENATE DIMEGLUMINE 529 MG/ML IV SOLN
20.0000 mL | Freq: Once | INTRAVENOUS | Status: AC | PRN
  Administered 2021-11-11: 20 mL via INTRAVENOUS

## 2022-10-07 ENCOUNTER — Other Ambulatory Visit: Payer: Self-pay | Admitting: Urology

## 2022-10-07 DIAGNOSIS — C61 Malignant neoplasm of prostate: Secondary | ICD-10-CM

## 2022-12-28 ENCOUNTER — Ambulatory Visit
Admission: RE | Admit: 2022-12-28 | Discharge: 2022-12-28 | Disposition: A | Discharge status: Home / Self Care | Payer: Commercial Managed Care - HMO | Source: Ambulatory Visit | Attending: Urology | Admitting: Urology

## 2022-12-28 DIAGNOSIS — C61 Malignant neoplasm of prostate: Secondary | ICD-10-CM

## 2022-12-28 MED ORDER — GADOPICLENOL 0.5 MMOL/ML IV SOLN
10.0000 mL | Freq: Once | INTRAVENOUS | Status: AC | PRN
  Administered 2022-12-28: 10 mL via INTRAVENOUS

## 2023-11-30 ENCOUNTER — Other Ambulatory Visit: Payer: Self-pay | Admitting: Urology

## 2023-11-30 DIAGNOSIS — C61 Malignant neoplasm of prostate: Secondary | ICD-10-CM

## 2023-11-30 DIAGNOSIS — R972 Elevated prostate specific antigen [PSA]: Secondary | ICD-10-CM

## 2023-12-01 ENCOUNTER — Encounter: Payer: Self-pay | Admitting: Urology

## 2024-01-25 ENCOUNTER — Ambulatory Visit
Admission: RE | Admit: 2024-01-25 | Discharge: 2024-01-25 | Disposition: A | Discharge status: Home / Self Care | Source: Ambulatory Visit | Attending: Urology | Admitting: Urology

## 2024-01-25 DIAGNOSIS — C61 Malignant neoplasm of prostate: Secondary | ICD-10-CM

## 2024-01-25 DIAGNOSIS — R972 Elevated prostate specific antigen [PSA]: Secondary | ICD-10-CM

## 2024-01-25 MED ORDER — GADOPICLENOL 0.5 MMOL/ML IV SOLN
10.0000 mL | Freq: Once | INTRAVENOUS | Status: AC | PRN
  Administered 2024-01-25: 10 mL via INTRAVENOUS

## 2024-04-25 ENCOUNTER — Ambulatory Visit: Admitting: Physician Assistant

## 2024-04-26 ENCOUNTER — Encounter: Payer: Self-pay | Admitting: Physician Assistant

## 2024-04-26 ENCOUNTER — Ambulatory Visit: Admitting: Physician Assistant

## 2024-04-26 ENCOUNTER — Other Ambulatory Visit: Payer: Self-pay

## 2024-04-26 DIAGNOSIS — S92354A Nondisplaced fracture of fifth metatarsal bone, right foot, initial encounter for closed fracture: Secondary | ICD-10-CM

## 2024-04-26 DIAGNOSIS — M79671 Pain in right foot: Secondary | ICD-10-CM

## 2024-04-26 NOTE — Progress Notes (Signed)
 Office Visit Note   Patient: Samuel Allen           Date of Birth: February 06, 1971           MRN: [id number] Visit Date: 04/26/2024              Requested by: Wilkie Majel RAMAN, FNP [address] [place],  [place] [address] PCP: Wilkie Majel RAMAN, FNP  Chief Complaint  Patient presents with   Right Foot - Injury      HPI: 53 y/o male with 2 week history of twisting his right foot.  He has swelling and pain over the lateral foot area.  The swelling has subsided, but he still has pain.  He works on his feet all day.  He is concern that he still has pain, but the swelling has gone down some.    Assessment & Plan: Visit Diagnoses:  1. Pain in right foot   2. Nondisplaced fracture of fifth metatarsal bone, right foot, initial encounter for closed fracture     Plan: He was placed in a Cam boot.   using a supportive walking boot or cast, allowing weight-bearing as tolerated, and resting the foot.  He will come out of the boot a few times a day and do active ankle ROM to prevent stiffness.  Elevation for swelling and to decrease pain.    Follow-Up Instructions: Return in about 2 weeks (around 05/10/2024).   Ortho Exam  Patient is alert, oriented, no adenopathy, well-dressed, normal affect, normal respiratory effort. Tenderness to palpation right distal fifth MT head area.  Good skin lines no apparent edema today.  Full motion of the ankle without increased discomfort.  Flexion infraversion and eversion.  Palpable DP pulse.      Imaging: Right fifth MT fracture minimally displaced mid MT.  Joint lines preserved.    Labs: No results found for: HGBA1C, ESRSEDRATE, CRP, LABURIC, REPTSTATUS, GRAMSTAIN, CULT, LABORGA   Lab Results  Component Value Date   ALBUMIN 4.7 02/20/2013    No results found for: MG No results found for: VD25OH  No results found for: PREALBUMIN     No data to display           There is no height or weight on  file to calculate BMI.  Orders:  Orders Placed This Encounter  Procedures   XR Foot Complete Right   No orders of the defined types were placed in this encounter.    Procedures: No procedures performed  Clinical Data: No additional findings.  ROS:  All other systems negative, except as noted in the HPI. Review of Systems  Objective: Vital Signs: There were no vitals taken for this visit.  Specialty Comments:  No specialty comments available.  PMFS History: Patient Active Problem List   Diagnosis Date Noted   Malignant neoplasm of prostate (HCC) 01/30/2020   Past Medical History:  Diagnosis Date   Hypertension    Prostate cancer (HCC)     Family History  Problem Relation Age of Onset   Brain cancer Mother    Prostate cancer Maternal Uncle    Prostate cancer Maternal Grandfather    Prostate cancer Maternal Uncle    Colon cancer Neg Hx    Pancreatic cancer Neg Hx    Breast cancer Neg Hx     Past Surgical History:  Procedure Laterality Date   PROSTATE BIOPSY     Social History   Occupational History   Not on file  Tobacco Use   Smoking status: Never   Smokeless tobacco: Never  Vaping Use   Vaping status: Never Used  Substance and Sexual Activity   Alcohol use: Yes   Drug use: Yes   Sexual activity: Yes

## 2024-05-09 ENCOUNTER — Ambulatory Visit: Admitting: Physician Assistant

## 2024-05-09 ENCOUNTER — Other Ambulatory Visit: Payer: Self-pay

## 2024-05-09 ENCOUNTER — Encounter: Payer: Self-pay | Admitting: Physician Assistant

## 2024-05-09 DIAGNOSIS — S92354A Nondisplaced fracture of fifth metatarsal bone, right foot, initial encounter for closed fracture: Secondary | ICD-10-CM | POA: Diagnosis not present

## 2024-05-09 NOTE — Progress Notes (Signed)
 Office Visit Note   Patient: Samuel Allen           Date of Birth: March 01, 1971           MRN: [id number] Visit Date: 05/09/2024              Requested by: Wilkie Majel RAMAN, FNP [address] [place],  [place] [address] PCP: Wilkie Majel RAMAN, FNP  Chief Complaint  Patient presents with   Right Foot - Fracture      HPI: 53 y/o male with a minimally displaced right fifth MT mid shaft fracture.  He is now 5 weeks out from injury.  He states his pain is much better about a 2/10 and that is at the end of the day.  He has no edema.  He is wearing a stiff tennis type shoe.    Assessment & Plan: Visit Diagnoses:  1. Nondisplaced fracture of fifth metatarsal bone, right foot, initial encounter for closed fracture     Plan: WBAT in a stiff shoe.  Elevate when at rest if he has edema.  Tylenol  and ice PRN.  No pounding activities right LE.   Follow-Up Instructions: Return in about 3 weeks (around 05/30/2024) for repeat right foot x ray.   Ortho Exam  Patient is alert, oriented, no adenopathy, well-dressed, normal affect, normal respiratory effort. Minimal tenderness to palpation later fifth MT, no edema or cellulitis.  Palpable DP pulse.  Non antalgic gait.  Full toe and ankle motion intact.    Imaging: Healing minimal displaced right mid shaft MT fracture.  No shortening of the bone with callus formation.    Labs: No results found for: HGBA1C, ESRSEDRATE, CRP, LABURIC, REPTSTATUS, GRAMSTAIN, CULT, LABORGA   Lab Results  Component Value Date   ALBUMIN 4.7 02/20/2013    No results found for: MG No results found for: VD25OH  No results found for: PREALBUMIN     No data to display           There is no height or weight on file to calculate BMI.  Orders:  Orders Placed This Encounter  Procedures   XR Foot Complete Right   No orders of the defined types were placed in this encounter.    Procedures: No procedures  performed  Clinical Data: No additional findings.  ROS:  All other systems negative, except as noted in the HPI. Review of Systems  Objective: Vital Signs: There were no vitals taken for this visit.  Specialty Comments:  No specialty comments available.  PMFS History: Patient Active Problem List   Diagnosis Date Noted   Malignant neoplasm of prostate (HCC) 01/30/2020   Past Medical History:  Diagnosis Date   Hypertension    Prostate cancer (HCC)     Family History  Problem Relation Age of Onset   Brain cancer Mother    Prostate cancer Maternal Uncle    Prostate cancer Maternal Grandfather    Prostate cancer Maternal Uncle    Colon cancer Neg Hx    Pancreatic cancer Neg Hx    Breast cancer Neg Hx     Past Surgical History:  Procedure Laterality Date   PROSTATE BIOPSY     Social History   Occupational History   Not on file  Tobacco Use   Smoking status: Never   Smokeless tobacco: Never  Vaping Use   Vaping status: Never Used  Substance and Sexual Activity   Alcohol use: Yes   Drug use: Yes  Sexual activity: Yes

## 2024-06-05 ENCOUNTER — Ambulatory Visit: Admitting: Physician Assistant
